# Patient Record
Sex: Male | Born: 1990 | Race: White | Hispanic: No | Marital: Single
Health system: Southern US, Community
[De-identification: ages and names within clinical notes are randomized; demographics above are authoritative.]

---
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
## 2012-10-11 ENCOUNTER — Ambulatory Visit: Payer: Self-pay | Admitting: Pediatrics

## 2014-07-17 ENCOUNTER — Ambulatory Visit: Payer: Self-pay | Admitting: Pediatrics
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
## 2022-07-31 ENCOUNTER — Other Ambulatory Visit: Payer: Self-pay | Admitting: Obstetrics and Gynecology

## 2022-07-31 DIAGNOSIS — Z3169 Encounter for other general counseling and advice on procreation: Secondary | ICD-10-CM

## 2022-07-31 NOTE — Progress Notes (Signed)
Semen analysis order due to no conception in 11 months. Wife is Flynt Breeze,   MRN:  [id number]

## 2022-08-20 ENCOUNTER — Other Ambulatory Visit: Payer: Self-pay

## 2022-08-28 LAB — SEMEN ANALYSIS, BASIC
Appearance: NORMAL
Concentration, Sperm: 20.6 x10E6/mL (ref >14.9)
Immotile Sperm: 48 %
Leukocyte Concentration: <1 x10E6/mL (ref <1.00)
Non-Progressive (NP): 20 %
Normal Morphology-Strict: 6 % (ref >3)
Progressive Motility (PR): 32 % (ref >31)
Progressively Motile Sperm: 32.5 x10E6
Time Collected: 935
Time Received: 1000
Time Since Last Emission: 2 days
Total Motile Sperm: 52.2 x10E6
Total Motility (PR+NP): 52 % (ref >39)
Total Sperm in Ejaculate: 101.1 x10E6 (ref >38.9)
Volume: 4.9 mL (ref >1.4)
pH: 8.5 (ref >7.1)
